# Patient Record
Sex: Female | Born: 1960 | Race: White | Hispanic: No | Marital: Married | Smoking: Current every day smoker
Health system: Southern US, Community
[De-identification: ages and names within clinical notes are randomized; demographics above are authoritative.]

## PROBLEM LIST (undated history)

## (undated) DIAGNOSIS — K589 Irritable bowel syndrome without diarrhea: Secondary | ICD-10-CM

## (undated) DIAGNOSIS — G4700 Insomnia, unspecified: Secondary | ICD-10-CM

## (undated) HISTORY — PX: ABLATION: SHX5711

## (undated) HISTORY — PX: TONSILLECTOMY: SUR1361

## (undated) HISTORY — PX: TUBAL LIGATION: SHX77

---

## 2016-02-09 ENCOUNTER — Emergency Department
Admission: EM | Admit: 2016-02-09 | Discharge: 2016-02-09 | Disposition: A | Discharge status: Home / Self Care | Payer: Federal, State, Local not specified - PPO | Source: Home / Self Care | Attending: Family Medicine | Admitting: Family Medicine

## 2016-02-09 ENCOUNTER — Encounter: Payer: Self-pay | Admitting: *Deleted

## 2016-02-09 DIAGNOSIS — J111 Influenza due to unidentified influenza virus with other respiratory manifestations: Secondary | ICD-10-CM | POA: Diagnosis not present

## 2016-02-09 DIAGNOSIS — H65191 Other acute nonsuppurative otitis media, right ear: Secondary | ICD-10-CM

## 2016-02-09 DIAGNOSIS — R69 Illness, unspecified: Principal | ICD-10-CM

## 2016-02-09 HISTORY — DX: Insomnia, unspecified: G47.00

## 2016-02-09 HISTORY — DX: Irritable bowel syndrome without diarrhea: K58.9

## 2016-02-09 MED ORDER — GUAIFENESIN-CODEINE 100-10 MG/5ML PO SOLN: ORAL | Status: DC

## 2016-02-09 MED ORDER — AMOXICILLIN 875 MG PO TABS: 875.0000 mg | ORAL_TABLET | Freq: Two times a day (BID) | ORAL | Status: DC

## 2016-02-09 NOTE — Discharge Instructions (Signed)
Take plain guaifenesin (1200mg  extended release tabs such as Mucinex) twice daily, with plenty of water, for cough and congestion.  Get adequate rest.   May use Afrin nasal spray (or generic oxymetazoline) twice daily for about 5 days and then discontinue.  Also recommend using saline nasal spray several times daily and saline nasal irrigation (AYR is a common brand).  Use Flonase nasal spray each morning after using Afrin nasal spray and saline nasal irrigation. Try warm salt water gargles for sore throat.  Stop all antihistamines for now, and other non-prescription cough/cold preparations. May take Ibuprofen 200mg , 4 tabs every 8 hours with food for body aches, headache, etc.   Follow-up with family doctor if not improving about 7 to 10 days.

## 2016-02-09 NOTE — ED Provider Notes (Signed)
CSN: [id number]     Arrival date & time 02/09/16  0830 History   First MD Initiated Contact with Patient 02/09/16 [phone]     Chief Complaint  Patient presents with  . Cough      HPI Comments: Complains of 4 day history flu-like illness including myalgias, headache, fever/chills, fatigue, and cough.  Her fever reached 101.6 yesteray.  Also has mild nasal congestion and sore throat.  Cough is non-productive and somewhat worse at night.  No pleuritic pain or shortness of breath.  Today she developed bilateral mild earache. She continues to smoke.  The history is provided by the patient.    Past Medical History  Diagnosis Date  . IBS (irritable bowel syndrome)   . Insomnia    Past Surgical History  Procedure Laterality Date  . Tonsillectomy    . Tubal ligation    . Ablation      uterine   Family History  Problem Relation Age of Onset  . Cancer Mother     breast  . Heart disease Father   . Stroke Father    Social History  Substance Use Topics  . Smoking status: Current Every Day Smoker -- 0.50 packs/day    Types: Cigarettes  . Smokeless tobacco: None  . Alcohol Use: Yes     Comment: 3-4 q wk   OB History    No data available     Review of Systems + sore throat + cough + sneezing No pleuritic pain No wheezing + nasal congestion + post-nasal drainage No sinus pain/pressure No itchy/red eyes + earache No hemoptysis No SOB + fever, + chills No nausea No vomiting No abdominal pain No diarrhea No urinary symptoms No skin rash + fatigue + myalgias + headache Used OTC meds without relief  Allergies  Sudafed  Home Medications   Prior to Admission medications   Medication Sig Start Date End Date Taking? Authorizing Provider  ALPRAZolam Prudy Feeler) 0.5 MG tablet Take 0.5 mg by mouth at bedtime as needed for anxiety.   Yes Historical Provider, MD  dicyclomine (BENTYL) 20 MG tablet Take 50 mg by mouth every 6 (six) hours.   Yes Historical Provider, MD  amoxicillin  (AMOXIL) 875 MG tablet Take 1 tablet (875 mg total) by mouth 2 (two) times daily. 02/09/16   Lattie Haw, MD  guaiFENesin-codeine 100-10 MG/5ML syrup Take 10mL by mouth at bedtime as needed for cough 02/09/16   Lattie Haw, MD   Meds Ordered and Administered this Visit  Medications - No data to display  BP 129/86 mmHg  Pulse 99  Temp(Src) 98.7 F (37.1 C) (Oral)  Resp 16  Ht 5' 2.5" (1.588 m)  Wt 148 lb (67.132 kg)  BMI 26.62 kg/m2  SpO2 95% No data found.   Physical Exam Nursing notes and Vital Signs reviewed. Appearance:  Patient appears stated age, and in no acute distress Eyes:  Pupils are equal, round, and reactive to light and accomodation.  Extraocular movement is intact.  Conjunctivae are not inflamed  Ears:  Canals normal.  Right tympanic membrane slightly bulging but otherwise normal.  Left tympanic membrane normal. Nose:  Mildly congested turbinates.  No sinus tenderness.    Pharynx:  Normal Neck:  Supple.  Tender enlarged posterior nodes are palpated bilaterally  Lungs:  Clear to auscultation.  Breath sounds are equal.  Moving air well. Heart:  Regular rate and rhythm without murmurs, rubs, or gallops.  Abdomen:  Nontender without masses or hepatosplenomegaly.  Bowel sounds are present.  No CVA or flank tenderness.  Extremities:  No edema.  Skin:  No rash present.   ED Course  Procedures none     Labs Reviewed -   Tympanogram:  Left ear positive peak pressure.  Right ear low peak height ("flat line")    MDM   1. Influenza-like illness   2. Acute nonsuppurative otitis media of right ear    Patient is beyond window for treatment with Tamiflu. Begin amoxicillin. Rx for Robitussin AC for night time cough.  Take plain guaifenesin (1200mg  extended release tabs such as Mucinex) twice daily, with plenty of water, for cough and congestion.  Get adequate rest.   May use Afrin nasal spray (or generic oxymetazoline) twice daily for about 5 days and then  discontinue.  Also recommend using saline nasal spray several times daily and saline nasal irrigation (AYR is a common brand).  Use Flonase nasal spray each morning after using Afrin nasal spray and saline nasal irrigation. Try warm salt water gargles for sore throat.  Stop all antihistamines for now, and other non-prescription cough/cold preparations. May take Ibuprofen 200mg , 4 tabs every 8 hours with food for body aches, headache, etc.   Follow-up with family doctor if not improving about 7 to 10 days.    Lattie HawStephen A Freya Zobrist, MD 02/09/16 [phone]

## 2016-02-09 NOTE — ED Notes (Signed)
Pt c/o nonproductive cough, fever 101.6, body aches and LT ear pain x 4 days, worse x 2 days. Needs work excuse.

## 2016-04-04 ENCOUNTER — Encounter: Payer: Self-pay | Admitting: *Deleted

## 2016-04-04 ENCOUNTER — Emergency Department
Admission: EM | Admit: 2016-04-04 | Discharge: 2016-04-04 | Disposition: A | Discharge status: Home / Self Care | Payer: Federal, State, Local not specified - PPO | Source: Home / Self Care | Attending: Family Medicine | Admitting: Family Medicine

## 2016-04-04 ENCOUNTER — Emergency Department (INDEPENDENT_AMBULATORY_CARE_PROVIDER_SITE_OTHER): Payer: Federal, State, Local not specified - PPO

## 2016-04-04 DIAGNOSIS — M79641 Pain in right hand: Secondary | ICD-10-CM

## 2016-04-04 DIAGNOSIS — M19041 Primary osteoarthritis, right hand: Secondary | ICD-10-CM

## 2016-04-04 NOTE — ED Provider Notes (Signed)
CSN: [id number]     Arrival date & time 04/04/16  1629 History   First MD Initiated Contact with Patient 04/04/16 1634     Chief Complaint  Patient presents with  . Hand Pain   (Consider location/radiation/quality/duration/timing/severity/associated sxs/prior Treatment) HPI The pt is a 55yo female presenting to [facility] with c/o gradually worsening Right hand pain over the last 2 months but worse over the last 1 week.  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  Denies known injury. Pain is aching and sore. Fingers feel like they get locked up and stiff in the morning.  Pain is worse at the base of her index and middle finger.  She has been taking Meloxicam, which she takes for occasional TMJ, but no significant relief of pain.    Past Medical History  Diagnosis Date  . IBS (irritable bowel syndrome)   . Insomnia    Past Surgical History  Procedure Laterality Date  . Tonsillectomy    . Tubal ligation    . Ablation      uterine   Family History  Problem Relation Age of Onset  . Cancer Mother     breast  . Heart disease Father   . Stroke Father    Social History  Substance Use Topics  . Smoking status: Current Every Day Smoker -- 0.50 packs/day    Types: Cigarettes  . Smokeless tobacco: None  . Alcohol Use: Yes     Comment: 3-4 q wk   OB History    No data available     Review of Systems  Musculoskeletal: Positive for myalgias and arthralgias. Negative for joint swelling.       Right hand  Skin: Negative for color change and wound.  Neurological: Positive for weakness ( due to pain and stiffness in Right hand) and numbness.    Allergies  Sudafed  Home Medications   Prior to Admission medications   Medication Sig Start Date End Date Taking? Authorizing Provider  ALPRAZolam Prudy Feeler) 0.5 MG tablet Take 0.5 mg by mouth at bedtime as needed for anxiety.   Yes Historical Provider, MD   dicyclomine (BENTYL) 20 MG tablet Take 50 mg by mouth every 6 (six) hours.   Yes Historical Provider, MD  Meloxicam (MOBIC PO) Take by mouth.   Yes Historical Provider, MD  montelukast (SINGULAIR) 10 MG tablet Take 10 mg by mouth at bedtime.   Yes Historical Provider, MD  traMADol (ULTRAM) 50 MG tablet Take by mouth every 6 (six) hours as needed.   Yes Historical Provider, MD   Meds Ordered and Administered this Visit  Medications - No data to display  BP 132/82 mmHg  Pulse 71  Wt 150 lb (68.04 kg)  SpO2 98% No data found.   Physical Exam  Constitutional: She is oriented to person, place, and time. She appears well-developed and well-nourished.  HENT:  Head: Normocephalic and atraumatic.  Eyes: EOM are normal.  Neck: Normal range of motion.  Cardiovascular: Normal rate.   Pulses:      Radial pulses are 2+ on the right side.  Pulmonary/Chest: Effort normal.  Musculoskeletal: Normal range of motion. She exhibits tenderness. She exhibits no edema.  Right hand and wrist: no deformity or edema. Full ROM wrist and hand. Tenderness to palmer aspect base of index and middle fingers. Negative Tinsel and Phalen's tests.   Neurological: She is alert and oriented to person,  place, and time.  Right hand: normal sensation compared to Left hand  Skin: Skin is warm and dry. No rash noted. No erythema.  Psychiatric: She has a normal mood and affect. Her behavior is normal.  Nursing note and vitals reviewed.   ED Course  Procedures (including critical care time)  Labs Review Labs Reviewed - No data to display  Imaging Review Dg Hand Complete Right  04/04/2016  CLINICAL DATA:  Worsening right hand pain for 3 months. No known injury. EXAM: RIGHT HAND - COMPLETE 3+ VIEW COMPARISON:  None. FINDINGS: There is no evidence of fracture or dislocation. Mild osteoarthritis is seen involving the interphalangeal joint of the little finger. No other significant bone abnormality identified. IMPRESSION: No  acute findings. 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 Electronically Signed   By: Myles RosenthalJohn  Stahl M.D.   On: 04/04/2016 17:25      MDM   1. Right hand pain    Pt c/o Right hand pain, no known injury. Right hand- PMS in tact. No evidence of underlying infection.  Plain films: no significant findings.   Will treat as overuse injury. Recommend wrist splint for 1-2 weeks, including wearing at night.  May have up to 15mg  (2 tabs) of her meloxicam. F/u with Sports Medicine if not improving. Patient verbalized understanding and agreement with treatment plan.      Junius FinnerErin O'Malley, PA-C 04/04/16 [phone]

## 2016-04-04 NOTE — Discharge Instructions (Signed)
You may take a total of 15mg  (2 tabs) of your Mobic daily for 1-2 weeks.

## 2016-04-04 NOTE — ED Notes (Signed)
Pt c/oright hand pain on her palm, gradual onset without injury. Pain radiates up her arm. 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

## 2016-10-25 ENCOUNTER — Encounter: Payer: Self-pay | Admitting: *Deleted

## 2016-10-25 ENCOUNTER — Emergency Department
Admission: EM | Admit: 2016-10-25 | Discharge: 2016-10-25 | Disposition: A | Discharge status: Home / Self Care | Payer: Federal, State, Local not specified - PPO | Source: Home / Self Care | Attending: Family Medicine | Admitting: Family Medicine

## 2016-10-25 DIAGNOSIS — J019 Acute sinusitis, unspecified: Secondary | ICD-10-CM | POA: Diagnosis not present

## 2016-10-25 DIAGNOSIS — R05 Cough: Secondary | ICD-10-CM

## 2016-10-25 DIAGNOSIS — R059 Cough, unspecified: Secondary | ICD-10-CM

## 2016-10-25 MED ORDER — DOXYCYCLINE HYCLATE 100 MG PO CAPS: 100.0000 mg | ORAL_CAPSULE | Freq: Two times a day (BID) | ORAL | 0 refills | Status: AC

## 2016-10-25 MED ORDER — BENZONATATE 100 MG PO CAPS: 100.0000 mg | ORAL_CAPSULE | Freq: Three times a day (TID) | ORAL | 0 refills | Status: AC

## 2016-10-25 MED ORDER — FLUTICASONE PROPIONATE 50 MCG/ACT NA SUSP: 2.0000 | Freq: Every day | NASAL | 2 refills | Status: AC

## 2016-10-25 NOTE — ED Triage Notes (Signed)
Pt c/o 2 months of intermittent sinus pressure, nasal congestion and non productive cough. Afebrile. Taken tylenol and IBF.

## 2016-10-25 NOTE — ED Provider Notes (Signed)
CSN: [id number]     Arrival date & time 10/25/16  0849 History   First MD Initiated Contact with Patient 10/25/16 [phone]     Chief Complaint  Patient presents with  . Sinus Problem  . Cough   (Consider location/radiation/quality/duration/timing/severity/associated sxs/prior Treatment) HPI  Kelly Hurley is a 55 y.o. female presenting to UC with c/o 2 months of intermittent sinus congestion, pressure, and non-productive cough from post-nasal drip.  She has taken Tylenol and ibuprofen with mild to moderate relief.  She was initially able to blow mucous out of her nose but now she feels it is stuck causing pain and pressure in her sinuses.  Associated Left ear pain that is aching and sore. Denies fever, chills, n/v/d. Pt notes her husband has also been sick. No recent travel.    Past Medical History:  Diagnosis Date  . IBS (irritable bowel syndrome)   . Insomnia    Past Surgical History:  Procedure Laterality Date  . ABLATION     uterine  . TONSILLECTOMY    . TUBAL LIGATION     Family History  Problem Relation Age of Onset  . Cancer Mother     breast  . Heart disease Father   . Stroke Father    Social History  Substance Use Topics  . Smoking status: Current Every Day Smoker    Packs/day: 0.50    Types: Cigarettes  . Smokeless tobacco: Never Used  . Alcohol use Yes     Comment: 3-4 q wk   OB History    No data available     Review of Systems  Constitutional: Negative for chills and fever.  HENT: Positive for congestion. Negative for ear pain, sore throat, trouble swallowing and voice change.   Respiratory: Negative for cough and shortness of breath.   Cardiovascular: Negative for chest pain and palpitations.  Gastrointestinal: Negative for abdominal pain, diarrhea, nausea and vomiting.  Musculoskeletal: Negative for arthralgias, back pain and myalgias.  Skin: Negative for rash.    Allergies  Sudafed [pseudoephedrine hcl]  Home Medications   Prior to Admission  medications   Medication Sig Start Date End Date Taking? Authorizing Provider  ALPRAZolam Prudy Feeler(XANAX) 0.5 MG tablet Take 0.5 mg by mouth at bedtime as needed for anxiety.    Historical Provider, MD  benzonatate (TESSALON) 100 MG capsule Take 1-2 capsules (100-200 mg total) by mouth every 8 (eight) hours. 10/25/16   Junius FinnerErin O'Malley, PA-C  dicyclomine (BENTYL) 20 MG tablet Take 50 mg by mouth every 6 (six) hours.    Historical Provider, MD  doxycycline (VIBRAMYCIN) 100 MG capsule Take 1 capsule (100 mg total) by mouth 2 (two) times daily. One po bid x 7 days 10/25/16   Junius FinnerErin O'Malley, PA-C  fluticasone [facility](FLONASE) 50 MCG/ACT nasal spray Place 2 sprays into both nostrils daily. 10/25/16   Junius FinnerErin O'Malley, PA-C  Meloxicam (MOBIC PO) Take by mouth.    Historical Provider, MD  montelukast (SINGULAIR) 10 MG tablet Take 10 mg by mouth at bedtime.    Historical Provider, MD  traMADol (ULTRAM) 50 MG tablet Take by mouth every 6 (six) hours as needed.    Historical Provider, MD   Meds Ordered and Administered this Visit  Medications - No data to display  BP 134/86 (BP Location: Left Arm)   Pulse 84   Temp 98.2 F (36.8 C) (Oral)   Resp 14   Wt 148 lb (67.1 kg)   SpO2 98%   BMI 26.64 kg/m  No data  found.   Physical Exam  Constitutional: She appears well-developed and well-nourished. No distress.  HENT:  Head: Normocephalic and atraumatic.  Right Ear: Tympanic membrane normal.  Left Ear: Tympanic membrane is injected.  Nose: Mucosal edema present. Right sinus exhibits maxillary sinus tenderness and frontal sinus tenderness. Left sinus exhibits maxillary sinus tenderness and frontal sinus tenderness.  Mouth/Throat: Uvula is midline, oropharynx is clear and moist and mucous membranes are normal.  Eyes: Conjunctivae are normal. No scleral icterus.  Neck: Normal range of motion. Neck supple.  Cardiovascular: Normal rate, regular rhythm and normal heart sounds.   Pulmonary/Chest: Effort normal and breath  sounds normal. No respiratory distress. She has no wheezes. She has no rales.  Abdominal: Soft. She exhibits no distension. There is no tenderness. There is no guarding.  Musculoskeletal: Normal range of motion.  Neurological: She is alert.  Skin: Skin is warm and dry. She is not diaphoretic.  Nursing note and vitals reviewed.   Urgent Care Course   Clinical Course     Procedures (including critical care time)  Labs Review Labs Reviewed - No data to display  Imaging Review No results found.    MDM   1. Acute rhinosinusitis   2. Cough    Pt c/o 2 months of sinus symptoms.  Sinus tenderness noted on exam. Left TM- injected but not bulging.   Will treat for bacterial cause of sinusitis.  Pt notes Augmentin causes GI upset. She does well with Doxycycline.  Rx: Doxycycline, tessalon and flonase. F/u with PCP in 1 week if needed.   Junius Finnerrin O'Malley, PA-C 10/25/16 1044

## 2017-05-28 IMAGING — DX DG HAND COMPLETE 3+V*R*
3 series · 3 of 3 positions shown · non-contrast
Comparison: None.

CLINICAL DATA: Worsening right hand pain for 3 months. No known
injury.

EXAM:
RIGHT HAND - COMPLETE 3+ VIEW

[hand pa]
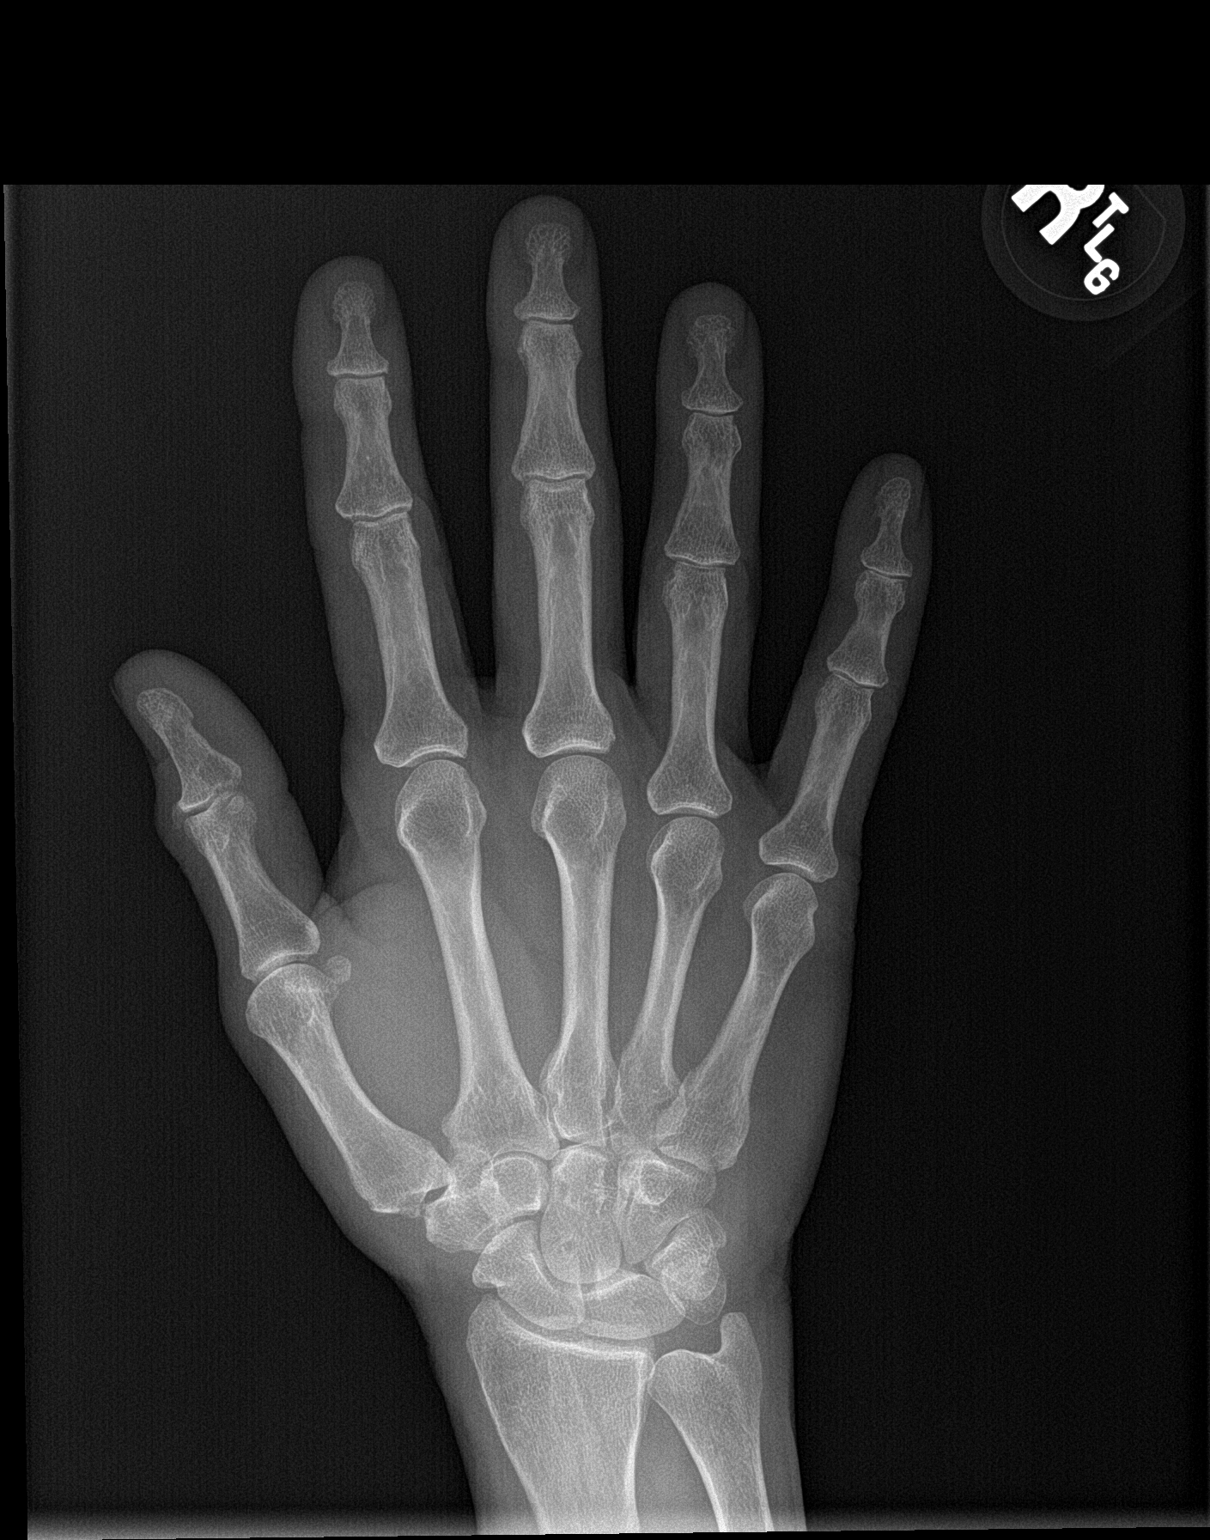

[hand obl]
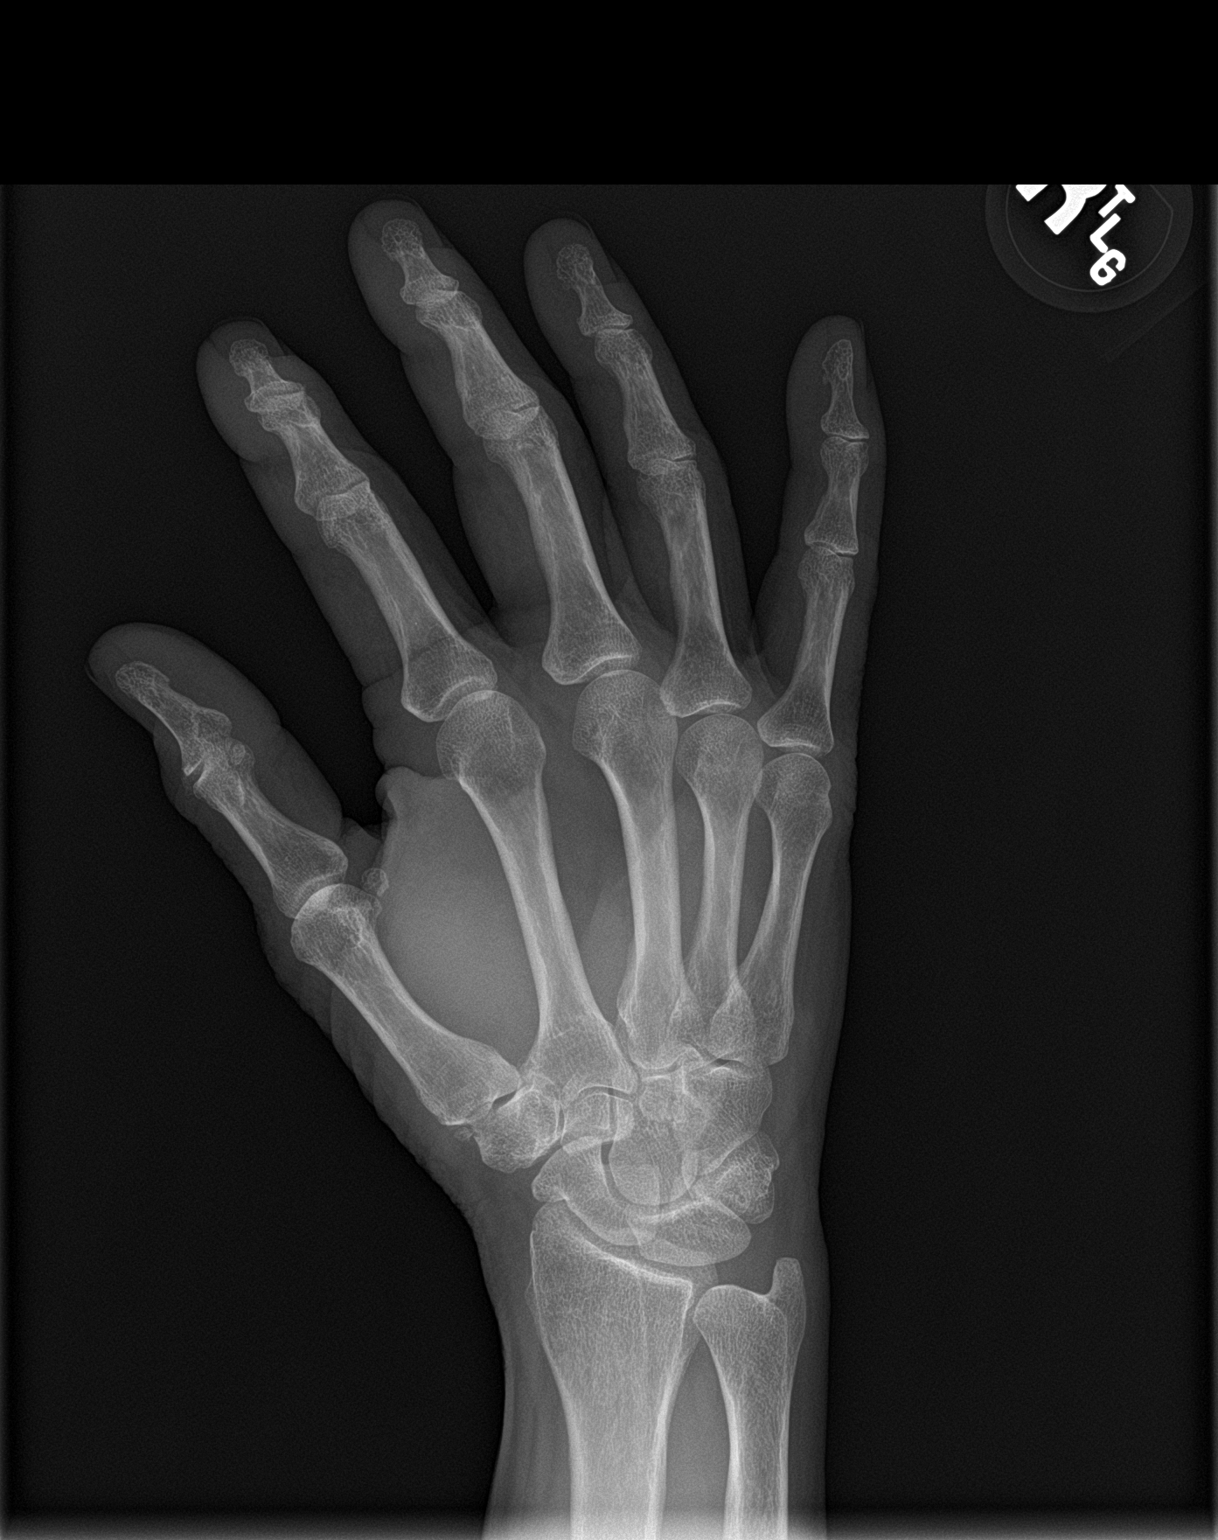

[hand lat]
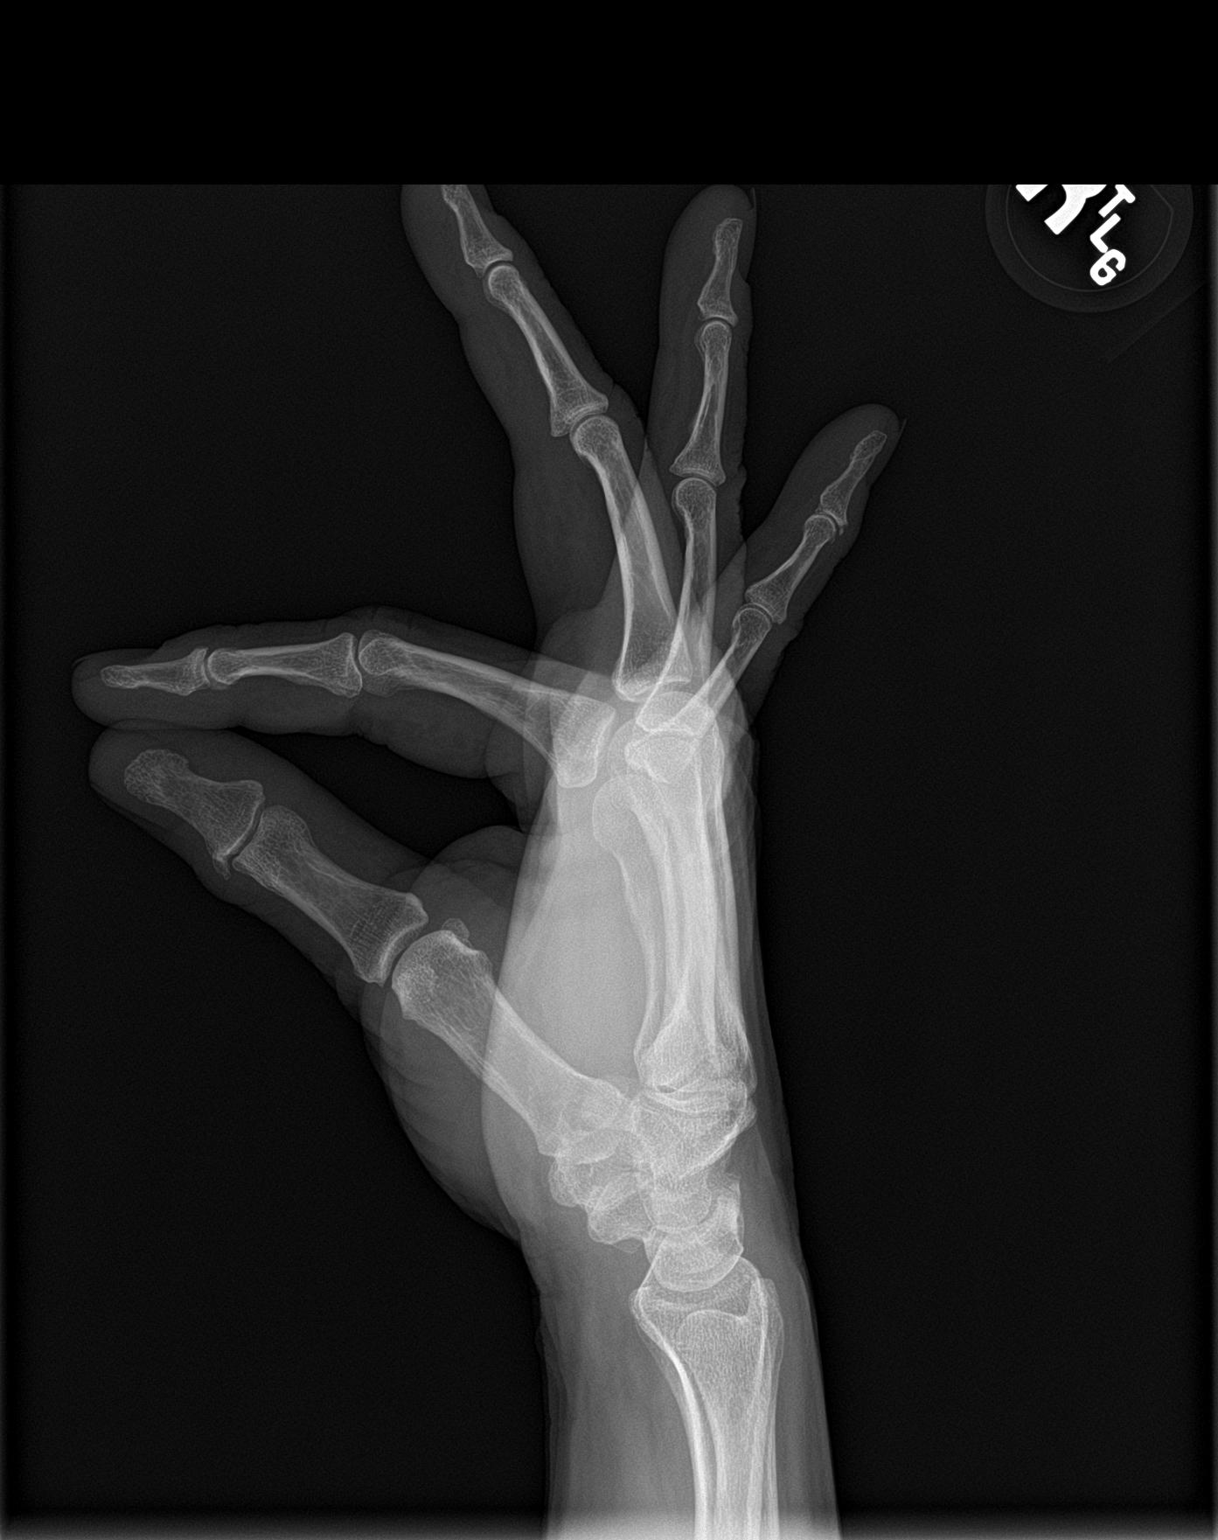

[3 of 3 positions shown; findings below may reference images not displayed]

FINDINGS: There is no evidence of fracture or dislocation. Mild osteoarthritis
is seen involving the interphalangeal joint of the little finger. No
other significant bone abnormality identified.
IMPRESSION: No acute findings. Mild osteoarthritis involving interphalangeal
joint of little finger.
